# Patient Record
Sex: Female | Born: 1960 | Race: White | Hispanic: No | Marital: Married | Smoking: Never smoker
Health system: Southern US, Community
[De-identification: ages and names within clinical notes are randomized; demographics above are authoritative.]

## PROBLEM LIST (undated history)

## (undated) DIAGNOSIS — C801 Malignant (primary) neoplasm, unspecified: Secondary | ICD-10-CM

## (undated) HISTORY — PX: ABDOMINAL HYSTERECTOMY: SHX81

## (undated) HISTORY — PX: COLON SURGERY: SHX602

---

## 2005-04-05 ENCOUNTER — Ambulatory Visit: Payer: Self-pay

## 2006-04-09 ENCOUNTER — Ambulatory Visit: Payer: Self-pay

## 2007-04-15 ENCOUNTER — Ambulatory Visit: Payer: Self-pay

## 2008-05-12 ENCOUNTER — Ambulatory Visit: Payer: Self-pay

## 2008-07-28 ENCOUNTER — Ambulatory Visit: Payer: Self-pay | Admitting: Obstetrics and Gynecology

## 2008-08-04 ENCOUNTER — Inpatient Hospital Stay: Payer: Self-pay | Admitting: Obstetrics and Gynecology

## 2009-05-18 ENCOUNTER — Ambulatory Visit: Payer: Self-pay

## 2010-06-20 ENCOUNTER — Ambulatory Visit: Payer: Self-pay

## 2011-03-20 DIAGNOSIS — L570 Actinic keratosis: Secondary | ICD-10-CM | POA: Insufficient documentation

## 2011-03-20 DIAGNOSIS — Z85820 Personal history of malignant melanoma of skin: Secondary | ICD-10-CM | POA: Insufficient documentation

## 2011-07-03 ENCOUNTER — Ambulatory Visit: Payer: Self-pay

## 2012-07-31 ENCOUNTER — Ambulatory Visit: Payer: Self-pay | Admitting: Unknown Physician Specialty

## 2012-08-11 ENCOUNTER — Ambulatory Visit: Payer: Self-pay

## 2012-08-18 ENCOUNTER — Ambulatory Visit: Payer: Self-pay | Admitting: Surgery

## 2012-08-22 ENCOUNTER — Inpatient Hospital Stay: Payer: Self-pay | Admitting: Surgery

## 2012-08-26 LAB — PATHOLOGY REPORT

## 2012-12-08 DIAGNOSIS — L719 Rosacea, unspecified: Secondary | ICD-10-CM | POA: Insufficient documentation

## 2013-08-18 ENCOUNTER — Ambulatory Visit: Payer: Self-pay

## 2014-07-26 ENCOUNTER — Ambulatory Visit: Payer: Self-pay | Admitting: Urology

## 2014-09-09 ENCOUNTER — Ambulatory Visit: Payer: Self-pay

## 2014-12-10 NOTE — Op Note (Signed)
PATIENT NAME:  Norma Hall, Norma Hall MR#:  [id number] DATE OF BIRTH:  [birth year]  DATE OF PROCEDURE:  08/22/2012  PREOPERATIVE DIAGNOSIS:  A neoplasm of the terminal ileum.   POSTOPERATIVE DIAGNOSIS: A neoplasm of the terminal ileum.   PROCEDURE: Laparoscopic ileocolectomy.   SURGEON: Rochel Brome, MD    ANESTHESIA: General.   INDICATIONS: This 54 year old female recently had screening colonoscopy with findings of a submucosal mass of the terminal ileum. She also had CT scan which demonstrated a bilobed mass at this site. No other intestinal masses were found. Surgery is recommended for definitive treatment.   DESCRIPTION OF PROCEDURE: The patient was placed on the operating table in the supine position under general endotracheal anesthesia. The abdomen was repaired with ChloraPrep, draped in a sterile manner.   A short incision was made just below the umbilicus and carried down through subcutaneous tissues. Several small bleeding points were cauterized.  The deep fascia was grasped with a laryngeal hook and elevated. A Veress needle was inserted, aspirated and irrigated with a saline solution. Next, the peritoneal cavity was inflated with the carbon dioxide. The Veress needle was removed. The 10 mm cannula was inserted. The 10 mm 25-degree laparoscope was inserted to view the peritoneal cavity and noted the location of the cecum in the right lower quadrant. The stomach appeared normal. The liver appeared normal. No masses were seen in the liver.  Next, another incision was made just about 4 cm cephalad to the umbilicus to insert an 11 mm cannula. Another incision was made in the right mid abdomen to introduce an 11 mm cannula.   Babcock clamps were used to elevate the cecum examine the terminal ileum, which did have visible evidence of a mass which was fairly close to the ileocecal valve. The appendix was identified and appeared normal. Next, the small bowel was examined, following this up to the  ligament of Treitz, and no other tumors were seen. Next, the cecum and ascending colon were mobilized with incision of the lateral peritoneal reflection using the harmonic scalpel. Also, the terminal ileum and appendix were mobilized as well; and this mobilization was continued until the point where the cecum and ascending colon were quite mobile and could be maneuvered over to the left side, and the appendix and terminal ileum were mobile as well. Next, completing this portion of the procedure, the laparoscopic instruments were removed. An incision was made from the epigastric port site to the infragastric port site, which was approximately 6 cm in length, and was carried down through subcutaneous tissues. The midline fascia was incised.  Next, the terminal ileum, appendix, cecum and ascending colon were brought out of the abdominal wall.  The mass was further identified just proximal to the ileocecal valve. It appeared to be some 4 cm in dimension and consistent with bilobed description from the CT. The mass was smooth and soft. There was no palpable adenopathy. Next, a proximal site for a  margin of resection was selected some 4 inches proximal to the ileocecal valve and another site some 5 inches distal to the ileocecal valve. The bowel was further dissected so that a window was created in the mesentery of the terminal ileum and also a window created so that an instrument could be passed around the ascending colon. Next, the mesenteric dissection was carried out with a harmonic scalpel, removing a V-shaped portion of tissue, dissecting down deeply along the ileocolic vessels. The ileocolic vessels were suture ligated with 0 chromic and the  mesentery was further divided with the harmonic scalpel. Next, the terminal ileum was placed beside the ascending colon and held with Allis clamps. Next, an enterotomy was made with electrocautery, and then a colotomy was made along the tinea coli in a similar manner. The  anastomosis was begun with introduction of the 75 mm GIA stapler which was placed along the antimesenteric borders and activated. Hemostasis appeared to be intact. Next, the anastomosis was completed with application of the TA 60 stapler which was placed perpendicular to the first, engaged and activated, and the specimen was excised along the staple line and passed off to a side table. Next, several bleeding points along the staple line were cauterized. Three bleeding points were suture ligated with 5-0 Vicryl. Next, the mesenteric defect was closed with a running 3-0 chromic. The junction of the staple lines was imbricated with 5-0 Vicryl. The anastomosis looked good and was widely patent, and hemostasis was subsequently intact. Next, this portion of bowel was returned to the abdominal cavity. A pool suction was inserted, and there was no significant amount of blood found along the right colic gutter. Next, a small amount of omentum was brought beneath the wound, and the midline fascia was closed with interrupted 0 Maxon figure-of-eight sutures. There was some oozing within the wound. Several small bleeding points were cauterized. This was observed for several minutes and determined hemostasis was intact. The subcutaneous tissues and tissues adjacent to the fascia were infiltrated with 0.5% Sensorcaine with epinephrine. The skin was approximated with interrupted 4-0 nylon vertical mattress      sutures. Dressings were applied with paper tape. The patient tolerated surgery satisfactorily and was then prepared for transfer to the recovery room.   ____________________________ Lenna Sciara. Rochel Brome, MD [email or web address]:cb D: 08/22/2012 16:41:58 ET T: 08/22/2012 18:44:15 ET JOB#: [id number]  cc: Loreli Dollar, MD, <Dictator> Loreli Dollar MD ELECTRONICALLY SIGNED 08/25/2012 19:31

## 2014-12-10 NOTE — Discharge Summary (Signed)
PATIENT NAME:  Norma Hall, Norma Hall MR#:  [id number] DATE OF BIRTH:  09-03-1960  DATE OF ADMISSION:  08/22/2012 DATE OF DISCHARGE:  08/27/2012  HISTORY OF PRESENT ILLNESS:  This 54 year old female was admitted for elective surgery.  She had recently had a screening colonoscopy which demonstrated a submucosal mass of the terminal ileum.  CT scan also demonstrated this mass and surgery was recommended for definitive treatment.   PAST MEDICAL HISTORY:  Occasional migraine headache.    PAST SURGICAL HISTORY:  Included hysterectomy in 2009.   PHYSICAL EXAMINATION: ABDOMEN:  Was soft and flat, nontender.   She was carried to the operating room where she had a laparoscopic right ileocolectomy.    Postoperatively, she was treated with IV fluids, analgesics, subcutaneous heparin.  It is noted she did have a preop dose of intravenous Invanz.   During the postoperative period she made satisfactory progress, soon was began on a liquid diet and gradually advanced, did demonstrate bowel activity prior to discharge.   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   DIAGNOSIS:  Neurofibroma of the terminal ileum.   OPERATION:  Laparoscopic ileocolectomy.   SURGEON:  Dr. Rochel Brome.   Discharge instructions were given and plans made for followup in the office.     ____________________________ J. Rochel Brome, MD [email or web address] D: 09/02/2012 18:52:44 ET T: 09/02/2012 23:50:13 ET JOB#: [id number]  cc: Loreli Dollar, MD, <Dictator> Loreli Dollar MD ELECTRONICALLY SIGNED 09/05/2012 16:58

## 2015-06-30 ENCOUNTER — Other Ambulatory Visit: Payer: Self-pay | Admitting: Obstetrics and Gynecology

## 2015-06-30 DIAGNOSIS — Z1231 Encounter for screening mammogram for malignant neoplasm of breast: Secondary | ICD-10-CM

## 2015-09-12 ENCOUNTER — Other Ambulatory Visit: Payer: Self-pay | Admitting: Obstetrics and Gynecology

## 2015-09-12 ENCOUNTER — Ambulatory Visit
Admission: RE | Admit: 2015-09-12 | Discharge: 2015-09-12 | Disposition: A | Discharge status: Home / Self Care | Payer: BC Managed Care – PPO | Source: Ambulatory Visit | Attending: Obstetrics and Gynecology | Admitting: Obstetrics and Gynecology

## 2015-09-12 DIAGNOSIS — Z1231 Encounter for screening mammogram for malignant neoplasm of breast: Secondary | ICD-10-CM

## 2015-09-12 DIAGNOSIS — R921 Mammographic calcification found on diagnostic imaging of breast: Secondary | ICD-10-CM | POA: Insufficient documentation

## 2015-09-12 HISTORY — DX: Malignant (primary) neoplasm, unspecified: C80.1

## 2015-09-15 ENCOUNTER — Other Ambulatory Visit: Payer: Self-pay | Admitting: Obstetrics and Gynecology

## 2015-09-15 DIAGNOSIS — R921 Mammographic calcification found on diagnostic imaging of breast: Secondary | ICD-10-CM

## 2015-09-23 ENCOUNTER — Ambulatory Visit: Admission: RE | Admit: 2015-09-23 | Payer: BC Managed Care – PPO | Source: Ambulatory Visit

## 2015-09-23 ENCOUNTER — Ambulatory Visit
Admission: RE | Admit: 2015-09-23 | Discharge: 2015-09-23 | Disposition: A | Discharge status: Home / Self Care | Payer: BC Managed Care – PPO | Source: Ambulatory Visit | Attending: Obstetrics and Gynecology | Admitting: Obstetrics and Gynecology

## 2015-09-23 DIAGNOSIS — R920 Mammographic microcalcification found on diagnostic imaging of breast: Secondary | ICD-10-CM | POA: Insufficient documentation

## 2015-09-23 DIAGNOSIS — R921 Mammographic calcification found on diagnostic imaging of breast: Secondary | ICD-10-CM

## 2015-09-26 ENCOUNTER — Other Ambulatory Visit: Payer: Self-pay | Admitting: Obstetrics and Gynecology

## 2015-09-26 DIAGNOSIS — R921 Mammographic calcification found on diagnostic imaging of breast: Secondary | ICD-10-CM

## 2015-09-27 ENCOUNTER — Ambulatory Visit
Admission: RE | Admit: 2015-09-27 | Discharge: 2015-09-27 | Disposition: A | Discharge status: Home / Self Care | Payer: BC Managed Care – PPO | Source: Ambulatory Visit | Attending: Obstetrics and Gynecology | Admitting: Obstetrics and Gynecology

## 2015-09-27 DIAGNOSIS — R921 Mammographic calcification found on diagnostic imaging of breast: Secondary | ICD-10-CM

## 2015-09-27 DIAGNOSIS — R920 Mammographic microcalcification found on diagnostic imaging of breast: Secondary | ICD-10-CM | POA: Diagnosis not present

## 2015-09-27 HISTORY — PX: BREAST BIOPSY: SHX20

## 2015-09-28 LAB — SURGICAL PATHOLOGY

## 2015-10-07 ENCOUNTER — Other Ambulatory Visit: Payer: Self-pay | Admitting: Obstetrics and Gynecology

## 2015-10-07 DIAGNOSIS — R920 Mammographic microcalcification found on diagnostic imaging of breast: Secondary | ICD-10-CM

## 2016-03-28 ENCOUNTER — Other Ambulatory Visit: Payer: BC Managed Care – PPO

## 2016-03-28 ENCOUNTER — Ambulatory Visit: Payer: BC Managed Care – PPO | Attending: Obstetrics and Gynecology

## 2016-05-11 ENCOUNTER — Ambulatory Visit
Admission: RE | Admit: 2016-05-11 | Discharge: 2016-05-11 | Disposition: A | Discharge status: Home / Self Care | Payer: BC Managed Care – PPO | Source: Ambulatory Visit | Attending: Obstetrics and Gynecology | Admitting: Obstetrics and Gynecology

## 2016-05-11 DIAGNOSIS — R920 Mammographic microcalcification found on diagnostic imaging of breast: Secondary | ICD-10-CM | POA: Diagnosis present

## 2016-05-11 DIAGNOSIS — R921 Mammographic calcification found on diagnostic imaging of breast: Secondary | ICD-10-CM | POA: Diagnosis not present

## 2016-07-04 ENCOUNTER — Other Ambulatory Visit: Payer: Self-pay | Admitting: Obstetrics and Gynecology

## 2016-07-04 DIAGNOSIS — Z1231 Encounter for screening mammogram for malignant neoplasm of breast: Secondary | ICD-10-CM

## 2016-09-13 ENCOUNTER — Ambulatory Visit
Admission: RE | Admit: 2016-09-13 | Discharge: 2016-09-13 | Disposition: A | Discharge status: Home / Self Care | Payer: BC Managed Care – PPO | Source: Ambulatory Visit | Attending: Obstetrics and Gynecology | Admitting: Obstetrics and Gynecology

## 2016-09-13 DIAGNOSIS — Z1231 Encounter for screening mammogram for malignant neoplasm of breast: Secondary | ICD-10-CM | POA: Insufficient documentation

## 2016-09-21 ENCOUNTER — Other Ambulatory Visit: Payer: Self-pay | Admitting: Obstetrics and Gynecology

## 2016-09-21 DIAGNOSIS — R928 Other abnormal and inconclusive findings on diagnostic imaging of breast: Secondary | ICD-10-CM

## 2016-09-21 DIAGNOSIS — N6310 Unspecified lump in the right breast, unspecified quadrant: Secondary | ICD-10-CM

## 2016-10-02 ENCOUNTER — Ambulatory Visit
Admission: RE | Admit: 2016-10-02 | Discharge: 2016-10-02 | Disposition: A | Discharge status: Home / Self Care | Payer: BC Managed Care – PPO | Source: Ambulatory Visit | Attending: Obstetrics and Gynecology | Admitting: Obstetrics and Gynecology

## 2016-10-02 DIAGNOSIS — N6489 Other specified disorders of breast: Secondary | ICD-10-CM | POA: Insufficient documentation

## 2016-10-02 DIAGNOSIS — R928 Other abnormal and inconclusive findings on diagnostic imaging of breast: Secondary | ICD-10-CM | POA: Diagnosis not present

## 2016-10-02 DIAGNOSIS — N6310 Unspecified lump in the right breast, unspecified quadrant: Secondary | ICD-10-CM | POA: Diagnosis present

## 2017-07-16 ENCOUNTER — Other Ambulatory Visit: Payer: Self-pay | Admitting: Obstetrics and Gynecology

## 2017-07-16 DIAGNOSIS — Z1231 Encounter for screening mammogram for malignant neoplasm of breast: Secondary | ICD-10-CM

## 2017-09-16 ENCOUNTER — Ambulatory Visit
Admission: RE | Admit: 2017-09-16 | Discharge: 2017-09-16 | Disposition: A | Discharge status: Home / Self Care | Payer: BC Managed Care – PPO | Source: Ambulatory Visit | Attending: Obstetrics and Gynecology | Admitting: Obstetrics and Gynecology

## 2017-09-16 DIAGNOSIS — Z1231 Encounter for screening mammogram for malignant neoplasm of breast: Secondary | ICD-10-CM | POA: Diagnosis present

## 2018-07-22 ENCOUNTER — Other Ambulatory Visit: Payer: Self-pay | Admitting: Obstetrics and Gynecology

## 2018-07-22 DIAGNOSIS — Z1231 Encounter for screening mammogram for malignant neoplasm of breast: Secondary | ICD-10-CM

## 2018-09-18 ENCOUNTER — Ambulatory Visit
Admission: RE | Admit: 2018-09-18 | Discharge: 2018-09-18 | Disposition: A | Discharge status: Home / Self Care | Payer: BC Managed Care – PPO | Source: Ambulatory Visit | Attending: Obstetrics and Gynecology | Admitting: Obstetrics and Gynecology

## 2018-09-18 DIAGNOSIS — Z1231 Encounter for screening mammogram for malignant neoplasm of breast: Secondary | ICD-10-CM

## 2019-07-23 ENCOUNTER — Ambulatory Visit: Payer: BC Managed Care – PPO | Admitting: General Surgery

## 2019-07-28 ENCOUNTER — Other Ambulatory Visit: Payer: Self-pay | Admitting: Obstetrics and Gynecology

## 2019-07-28 DIAGNOSIS — Z1231 Encounter for screening mammogram for malignant neoplasm of breast: Secondary | ICD-10-CM

## 2019-07-30 ENCOUNTER — Ambulatory Visit
Admission: RE | Admit: 2019-07-30 | Discharge: 2019-07-30 | Disposition: A | Discharge status: Home / Self Care | Payer: Self-pay | Source: Ambulatory Visit | Attending: General Surgery | Admitting: General Surgery

## 2019-07-30 ENCOUNTER — Ambulatory Visit (INDEPENDENT_AMBULATORY_CARE_PROVIDER_SITE_OTHER): Payer: BC Managed Care – PPO | Admitting: General Surgery

## 2019-07-30 ENCOUNTER — Other Ambulatory Visit: Payer: Self-pay | Admitting: General Surgery

## 2019-07-30 ENCOUNTER — Other Ambulatory Visit: Payer: Self-pay

## 2019-07-30 ENCOUNTER — Encounter: Payer: Self-pay | Admitting: General Surgery

## 2019-07-30 VITALS — BP 108/65 | HR 76 | Temp 97.3°F | Resp 14 | Ht 68.0 in | Wt 156.0 lb

## 2019-07-30 DIAGNOSIS — E042 Nontoxic multinodular goiter: Secondary | ICD-10-CM | POA: Diagnosis not present

## 2019-07-30 DIAGNOSIS — E041 Nontoxic single thyroid nodule: Secondary | ICD-10-CM

## 2019-07-30 NOTE — Progress Notes (Signed)
Patient ID: Norma Hall, female   DOB: 10/21/1960, 58 y.o.   MRN: [id number]  Chief Complaint  Patient presents with  . New Patient (Initial Visit)    Thyroid nodules    HPI Norma Hall is a 58 y.o. female.   She has been referred by Dr. Elisabeth Cara for surgical evaluation of thyroid nodules.  She has 3 thyroid nodules that have been monitored with ultrasound.  The dominant nodule in the isthmus has been biopsied on several occasions and been benign on each evaluation.  She saw Dr. Gabriel Carina in the middle of November and reported that she felt like the nodule in her isthmus had grown over the past year.  She denies any dysphagia or pressure in the neck while in the supine position.  She does notice the nodule when she turns her head.  She has occasional hoarseness or scratchiness to her voice, but this is chronic and unchanged.  She does endorse frequent throat clearing.  She denies heat or cold intolerance.  No diarrhea or constipation.  No heart palpitations or hand tremors.  She does state that she feels anxious about our visit today, but this is not a ongoing issue.  She reports that her hair does seem thinner and that her skin is dry.  Her weight has been stable.  She denies any ocular symptoms.  She has no head and neck exposure to ionizing radiation, either occupational or therapeutic.  Her sister had thyroid cancer, treated with surgery and radioactive iodine.  She also has a friend who was diagnosed with thyroid cancer.  She is concerned that her nodules could potentially evolve into cancer and she would like to be proactive in addressing them.   Past Medical History:  Diagnosis Date  . Cancer ([place])    melanoma    Past Surgical History:  Procedure Laterality Date  . ABDOMINAL HYSTERECTOMY    . BREAST BIOPSY Right 09/27/2015   neg/ stereo biopsy  . COLON SURGERY     neurofibroma of terminal ileum    Family History  Problem Relation Age of Onset  . Breast cancer Maternal  Grandmother 80  . Hypertension Mother   . Dementia Father   . Thyroid cancer Sister     Social History Social History   Tobacco Use  . Smoking status: Never Smoker  . Smokeless tobacco: Never Used  Substance Use Topics  . Alcohol use: Never  . Drug use: Never    Allergies  Allergen Reactions  . Morphine Nausea Only and Other (See Comments)    Current Outpatient Medications  Medication Sig Dispense Refill  . adapalene (DIFFERIN) 0.1 % cream Apply pea sized amount to entire face nightly    . mometasone (ELOCON) 0.1 % cream Apply topically.     No current facility-administered medications for this visit.    Review of Systems Review of Systems  All other systems reviewed and are negative. Or as discussed in the history of present illness.   Today's Vitals   07/30/19 0905  BP: 108/65  Pulse: 76  Resp: 14  Temp: (!) 97.3 F (36.3 C)  SpO2: 98%  Weight: 156 lb (70.8 kg)  Height: 5\' 8"  (1.727 m)  PainSc: 0-No pain   Body mass index is 23.72 kg/m.  Physical Exam Physical Exam Constitutional:      Appearance: Normal appearance. She is normal weight.  HENT:     Head: Normocephalic and atraumatic.     Nose:     Comments:  Covered with a mask secondary to COVID-19 precautions    Mouth/Throat:     Comments: Covered with a mask secondary to COVID-19 precautions Eyes:     General: No scleral icterus.       Right eye: No discharge.        Left eye: No discharge.     Conjunctiva/sclera: Conjunctivae normal.     Comments: No proptosis or exophthalmos  Neck:     Comments: There is a visible thyroid nodule present in the isthmus.  On palpation, it is smoothly contoured and moves freely with deglutition.  It has a slightly rubbery texture to it.  She has no palpable cervical or supraclavicular lymphadenopathy. Cardiovascular:     Rate and Rhythm: Normal rate and regular rhythm.     Pulses: Normal pulses.     Heart sounds: No murmur.  Pulmonary:     Effort: Pulmonary  effort is normal.     Breath sounds: Normal breath sounds.  Abdominal:     General: Abdomen is flat. Bowel sounds are normal.     Palpations: Abdomen is soft.  Genitourinary:    Comments: Deferred Musculoskeletal:        General: No swelling or tenderness.     Right lower leg: No edema.     Left lower leg: No edema.  Skin:    General: Skin is warm and dry.  Neurological:     General: No focal deficit present.     Mental Status: She is alert and oriented to person, place, and time.  Psychiatric:        Mood and Affect: Mood normal.        Behavior: Behavior normal.        Thought Content: Thought content normal.     Data Reviewed I reviewed Dr. Joycie Peek notes over the past several years.  The most recent of these was filed July 02, 2019.  This includes a report of a thyroid ultrasound which is copied here; the actual images are not available for my review.  The patient did bring disks with imaging from prior ultrasounds.  On both of these, only the thyroid isthmus nodule is depicted.  My personal interpretation is of a roughly 2 cm solid and hypoechoic nodule within the isthmus.  It is well-circumscribed and has an anechoic halo.  Indication: Thyroid nodules  Comparison: Neck ultrasounds on 06/11/18, 05/17/17, 05/11/16, 10/14/15 and  04/14/15  Technique: Gray-scale and color Doppler images of the neck were obtained.  Findings: The right lobe of the thyroid measures 5.18 x 1.28 x 1.07 cm. The left lobe of the thyroid measures 5.86 x 1.46 x 1.39 cm. The isthmus measures 0.61 cm in AP depth.  Echotexture of the thyroid is homogeneous.  Within the right lobe, there is an upper pole 8 mm cystic nodule.  Within the left lobe, there is an upper pole 6 mm cystic nodule and a mid  pole 1.12 x 0.61 x 0.87 cm hypoechoic nodule.   In the left aspect of the isthmus is a 2.21 x 1.41 x 1.86 cm cystic  nodule.   There are no calcifications or regions of hypervascularity  within the  thyroid gland.  There are no significantly enlarged lymph nodes in the anterior neck.  Impression: Homogeneous thyroid gland which is not enlarged. There are multiple  nodules throughout the thyroid gland. This is consistent with a  multinodular goiter.  There is a dominant 2.2 cm cystic isthmus nodule.   All nodules are  stable in comparison to last thyroid ultrasound on  06/11/18.  Thyroid function testing was performed.  I reviewed the results in care everywhere; dating back to 2015, her thyroid function tests have been within normal limits.  The most recent value was a TSH of 1.099.  Biopsy reports were provided by Dr. Joycie Peek office.  These were performed in 2017 and 2018.  On both occasions, the lesion was benign.  Assessment This is a 58 year old woman with a multinodular goiter.  She has had the dominant nodule in the isthmus biopsied on 2 separate occasions.  Both times it was benign.  She also has a small cystic nodule on the right as well as 2 small nodules on the left.  She feels like the isthmus nodule is growing.  She is concerned about potential malignant transformation as well as simply the cosmesis of the nodule.  She is interested in surgical intervention.  Plan I spent greater than 50% of our 45-minute appointment today discussing thyroid nodules and their natural history with Ms. Tufte.  I explained that the majority of thyroid nodules are benign, and that they are extremely common in the general population.  I discussed that she has had 2 benign biopsies of the same nodule and that it was highly unlikely that there would be missed malignancy or malignant transformation in this nodule.  I explained that the other lesions in her thyroid do not have any red flag features to them, either.  I explained the various surgical options to her.  Isthmusectomy is not an operation that is generally recommended.  We normally perform a thyroid lobectomy and include the  isthmus in order to resect isthmic nodules.  I discussed the risks of thyroid lobectomy versus total thyroidectomy.  If she were to choose lobectomy, I suggested that we remove the left lobe and isthmus, leaving only the right lobe with a small cystic lesion for ongoing surveillance.  I discussed the risk of injury to the recurrent laryngeal nerve and that with lobectomy, only one nerve is at risk, whereas with total thyroidectomy, both nerves are potentially at risk.  I also assured her that the risk of permanent injury to either nerve is less than one half of 1% in experienced hands.  I described the role of the parathyroid glands and their location in relationship to the thyroid.  We discussed that a lobectomy would not subject her to the risks of hypocalcemia/hypoparathyroidism, whereas there is a roughly 8% risk of temporary hypocalcemia with a total thyroidectomy.  Roughly 85% of patients who undergo thyroid lobectomy do not require thyroid hormone replacement, but about 15% ultimately do.  If she had a total thyroidectomy, she would, of course, require thyroid hormone replacement for life.  I also let her know that I could certainly perform a completion thyroidectomy in the future, if she ultimately desired removal of all of her thyroid tissue.  I explained to Ms. Distel that cosmetic concerns and alleviation of anxiety are certainly very reasonable reasons to pursue surgical intervention.  She is interested in pursuing surgery at this time, however she is not certain which procedure she would prefer.  She would like some time to discuss this further with her family.  She will contact our office when she is ready to schedule an operation and also let us know which procedure she prefers.    Fredirick Maudlin 07/30/2019, 11:42 AM

## 2019-07-30 NOTE — Patient Instructions (Signed)
Please call us once you decide what surgery you would like and our surgery scheduler will speak with you about dates and information.    Thyroidectomy A thyroidectomy is a surgery that is done to remove the thyroid gland. The thyroid is a butterfly-shaped gland that is located at the lower front of your neck. It produces thyroid hormone, which is a substance that helps to control certain body processes. You may have a:  Total thyroidectomy. All of your thyroid is removed.  Thyroid lobectomy. Part of your thyroid is removed. The amount of thyroid gland tissue that is removed during your surgery depends on the reason for the procedure. Reasons to have this procedure include treatment for:  Thyroid nodules.  Thyroid cancer.  Benign thyroid tumors.  Goiter.  Overactive thyroid gland (hyperthyroidism). There are two ways to do this procedure. Conventional, or open, thyroidectomy uses one large incision to remove the thyroid gland. This is the most common method. Endoscopic thyroidectomy, a less invasive method, uses a narrow tube with a light and camera (endoscope) to remove the gland. Tell a health care provider about:  Any allergies you have.  All medicines you are taking, including vitamins, herbs, eye drops, creams, and over-the-counter medicines.  Any problems you or family members have had with anesthetic medicines.  Any blood disorders you have.  Any surgeries you have had.  Any medical conditions you have.  Whether you are pregnant or may be pregnant. What are the risks? Generally, this is a safe procedure. However, problems may occur, including:  Damage to the parathyroid glands. These are located behind your thyroid gland. They maintain the calcium levels in the body. Damage may lead to: ? A decrease in parathyroid hormone levels (hypoparathyroidism). ? A decrease in calcium levels. This will make your nerves irritable and may cause muscle spasms.  An increase in  thyroid hormone.  Damage to the nerves of your voice box (larynx). This can be temporary or long-term (rare).  Hoarseness. This usually resolves in 24-48 hours.  Bleeding.  Infection. What happens before the procedure? Staying hydrated Follow instructions from your health care provider about hydration, which may include:  Up to 2 hours before the procedure - you may continue to drink clear liquids, such as water, clear fruit juice, black coffee, and plain tea. Eating and drinking restrictions Follow instructions from your health care provider about eating and drinking, which may include:  8 hours before the procedure - stop eating heavy meals or foods such as meat, fried foods, or fatty foods.  6 hours before the procedure - stop eating light meals or foods, such as toast or cereal.  6 hours before the procedure - stop drinking milk or drinks that contain milk.  2 hours before the procedure - stop drinking clear liquids. Medicines Ask your health care provider about:  Changing or stopping your regular medicines. This is especially important if you are taking diabetes medicines or blood thinners.  Taking medicines such as aspirin and ibuprofen. These medicines can thin your blood. Do not take these medicines unless your health care provider tells you to take them.  Taking over-the-counter medicines, vitamins, herbs, and supplements. General instructions  You may be asked to shower with a germ-killing soap.  Plan to have someone take you home from the hospital or clinic.  Plan to have a responsible adult care for you for at least 24 hours after you leave the hospital or clinic. This is important. What happens during the procedure?  To reduce your risk of infection: ? Your health care team will wash or sanitize their hands. ? Hair may be removed from the surgical area. ? Your skin will be washed with soap.  An IV will be inserted into one of your veins.  You will be given  one or more of the following: ? A medicine to help you relax (sedative). ? A medicine to make you fall asleep (general anesthetic).  Your health care provider will perform your surgery using one of two methods: ? For open thyroidectomy, an incision will be made in your lower neck. Muscles in the area will be separated to reveal your thyroid gland. ? For endoscopic thyroidectomy, several small incisions will be made in your neck, chest, or armpit. An endoscopewill be inserted into an incision.  Your health care provider may monitor laryngeal nerve function during the procedure for safety reasons.  Part or all of your thyroid gland will be removed.  A tube (drain) may be placed at the incision site to drain blood and fluids that accumulate under the skin after the procedure. The drain may have to stay in place for a day or two after the procedure.  The incision will be closed with stitches (sutures).  A dressing will be placed over your incision. The procedure may vary among health care providers and hospitals. What happens after the procedure?  Your blood pressure, heart rate, breathing rate, and blood oxygen level will be monitored often until the medicines you were given have worn off.  You will be given pain medicine as needed.  Your provider will check your ability to talk and swallow after the procedure.  You will gradually start to drink liquids and have soft foods as tolerated.  You may have a blood test to check the level of calcium in your body.  If you had a drain put in during the procedure, it will usually be removed the next day. Summary  A thyroidectomy is a surgery that is done to remove the thyroid gland.  The procedure will be done in one of two ways: conventional, or open, thyroidectomy or endoscopic thyroidectomy.  Serious complications are rare.  Plan to have a responsible adult care for you for at least 24 hours after you leave the hospital or clinic. This  is important. This information is not intended to replace advice given to you by your health care provider. Make sure you discuss any questions you have with your health care provider. Document Released: 01/30/2001 Document Revised: 07/19/2017 Document Reviewed: 06/11/2017 Elsevier Patient Education  2020 [employer].

## 2019-08-12 ENCOUNTER — Telehealth: Payer: Self-pay | Admitting: General Surgery

## 2019-08-18 NOTE — Telephone Encounter (Signed)
I have called patient to go over surgery information. Patient stated that she would like to cancel surgery at this time due to the Covid Pandemic. She stated that she would call our office back once she decides to reschedule.

## 2019-08-18 NOTE — Telephone Encounter (Signed)
OK. Thanks.

## 2019-09-09 ENCOUNTER — Other Ambulatory Visit: Payer: BC Managed Care – PPO

## 2019-09-10 ENCOUNTER — Other Ambulatory Visit: Payer: BC Managed Care – PPO

## 2019-09-14 ENCOUNTER — Other Ambulatory Visit: Payer: BC Managed Care – PPO

## 2019-09-16 ENCOUNTER — Ambulatory Visit: Admit: 2019-09-16 | Payer: BC Managed Care – PPO | Admitting: General Surgery

## 2019-09-16 SURGERY — THYROIDECTOMY
Anesthesia: General

## 2019-09-22 ENCOUNTER — Ambulatory Visit
Admission: RE | Admit: 2019-09-22 | Discharge: 2019-09-22 | Disposition: A | Discharge status: Home / Self Care | Payer: BC Managed Care – PPO | Source: Ambulatory Visit | Attending: Obstetrics and Gynecology | Admitting: Obstetrics and Gynecology

## 2019-09-22 DIAGNOSIS — Z1231 Encounter for screening mammogram for malignant neoplasm of breast: Secondary | ICD-10-CM | POA: Insufficient documentation

## 2020-08-08 ENCOUNTER — Other Ambulatory Visit: Payer: Self-pay | Admitting: Obstetrics and Gynecology

## 2020-08-08 DIAGNOSIS — Z1231 Encounter for screening mammogram for malignant neoplasm of breast: Secondary | ICD-10-CM

## 2020-09-22 ENCOUNTER — Ambulatory Visit
Admission: RE | Admit: 2020-09-22 | Discharge: 2020-09-22 | Disposition: A | Discharge status: Home / Self Care | Payer: BC Managed Care – PPO | Source: Ambulatory Visit | Attending: Obstetrics and Gynecology | Admitting: Obstetrics and Gynecology

## 2020-09-22 ENCOUNTER — Other Ambulatory Visit: Payer: Self-pay

## 2020-09-22 DIAGNOSIS — Z1231 Encounter for screening mammogram for malignant neoplasm of breast: Secondary | ICD-10-CM | POA: Diagnosis not present

## 2021-05-29 ENCOUNTER — Encounter: Payer: Self-pay | Admitting: General Surgery

## 2021-09-21 ENCOUNTER — Other Ambulatory Visit: Payer: Self-pay | Admitting: Obstetrics and Gynecology

## 2021-09-21 DIAGNOSIS — Z1231 Encounter for screening mammogram for malignant neoplasm of breast: Secondary | ICD-10-CM

## 2021-09-27 ENCOUNTER — Ambulatory Visit
Admission: RE | Admit: 2021-09-27 | Discharge: 2021-09-27 | Disposition: A | Discharge status: Home / Self Care | Payer: BC Managed Care – PPO | Source: Ambulatory Visit | Attending: Obstetrics and Gynecology | Admitting: Obstetrics and Gynecology

## 2021-09-27 DIAGNOSIS — Z1231 Encounter for screening mammogram for malignant neoplasm of breast: Secondary | ICD-10-CM | POA: Diagnosis not present

## 2021-10-04 ENCOUNTER — Other Ambulatory Visit: Payer: Self-pay | Admitting: Obstetrics and Gynecology

## 2021-10-04 DIAGNOSIS — R921 Mammographic calcification found on diagnostic imaging of breast: Secondary | ICD-10-CM

## 2021-10-04 DIAGNOSIS — R928 Other abnormal and inconclusive findings on diagnostic imaging of breast: Secondary | ICD-10-CM

## 2021-10-10 ENCOUNTER — Other Ambulatory Visit: Payer: Self-pay

## 2021-10-10 ENCOUNTER — Ambulatory Visit
Admission: RE | Admit: 2021-10-10 | Discharge: 2021-10-10 | Disposition: A | Discharge status: Home / Self Care | Payer: BC Managed Care – PPO | Source: Ambulatory Visit | Attending: Obstetrics and Gynecology | Admitting: Obstetrics and Gynecology

## 2021-10-10 DIAGNOSIS — R921 Mammographic calcification found on diagnostic imaging of breast: Secondary | ICD-10-CM | POA: Diagnosis present

## 2021-10-10 DIAGNOSIS — R928 Other abnormal and inconclusive findings on diagnostic imaging of breast: Secondary | ICD-10-CM | POA: Insufficient documentation

## 2021-10-16 ENCOUNTER — Other Ambulatory Visit: Payer: Self-pay | Admitting: Obstetrics and Gynecology

## 2021-10-16 DIAGNOSIS — R921 Mammographic calcification found on diagnostic imaging of breast: Secondary | ICD-10-CM

## 2021-10-16 DIAGNOSIS — R928 Other abnormal and inconclusive findings on diagnostic imaging of breast: Secondary | ICD-10-CM

## 2021-10-20 ENCOUNTER — Ambulatory Visit
Admission: RE | Admit: 2021-10-20 | Discharge: 2021-10-20 | Disposition: A | Discharge status: Home / Self Care | Payer: BC Managed Care – PPO | Source: Ambulatory Visit | Attending: Obstetrics and Gynecology | Admitting: Obstetrics and Gynecology

## 2021-10-20 ENCOUNTER — Other Ambulatory Visit: Payer: Self-pay

## 2021-10-20 DIAGNOSIS — R921 Mammographic calcification found on diagnostic imaging of breast: Secondary | ICD-10-CM | POA: Insufficient documentation

## 2021-10-20 DIAGNOSIS — R928 Other abnormal and inconclusive findings on diagnostic imaging of breast: Secondary | ICD-10-CM | POA: Insufficient documentation

## 2021-10-20 HISTORY — PX: BREAST BIOPSY: SHX20

## 2021-10-23 LAB — SURGICAL PATHOLOGY

## 2021-10-25 ENCOUNTER — Ambulatory Visit: Payer: BC Managed Care – PPO

## 2022-07-01 IMAGING — MG MM BREAST LOCALIZATION CLIP
4 series · 4 of 12 positions shown · non-contrast
Comparison: Previous exam(s).

CLINICAL DATA: Post biopsy mammogram of the left breast for clip
placement.

EXAM:
3D DIAGNOSTIC LEFT MAMMOGRAM POST STEREOTACTIC BIOPSY

[L ML synth-2D]
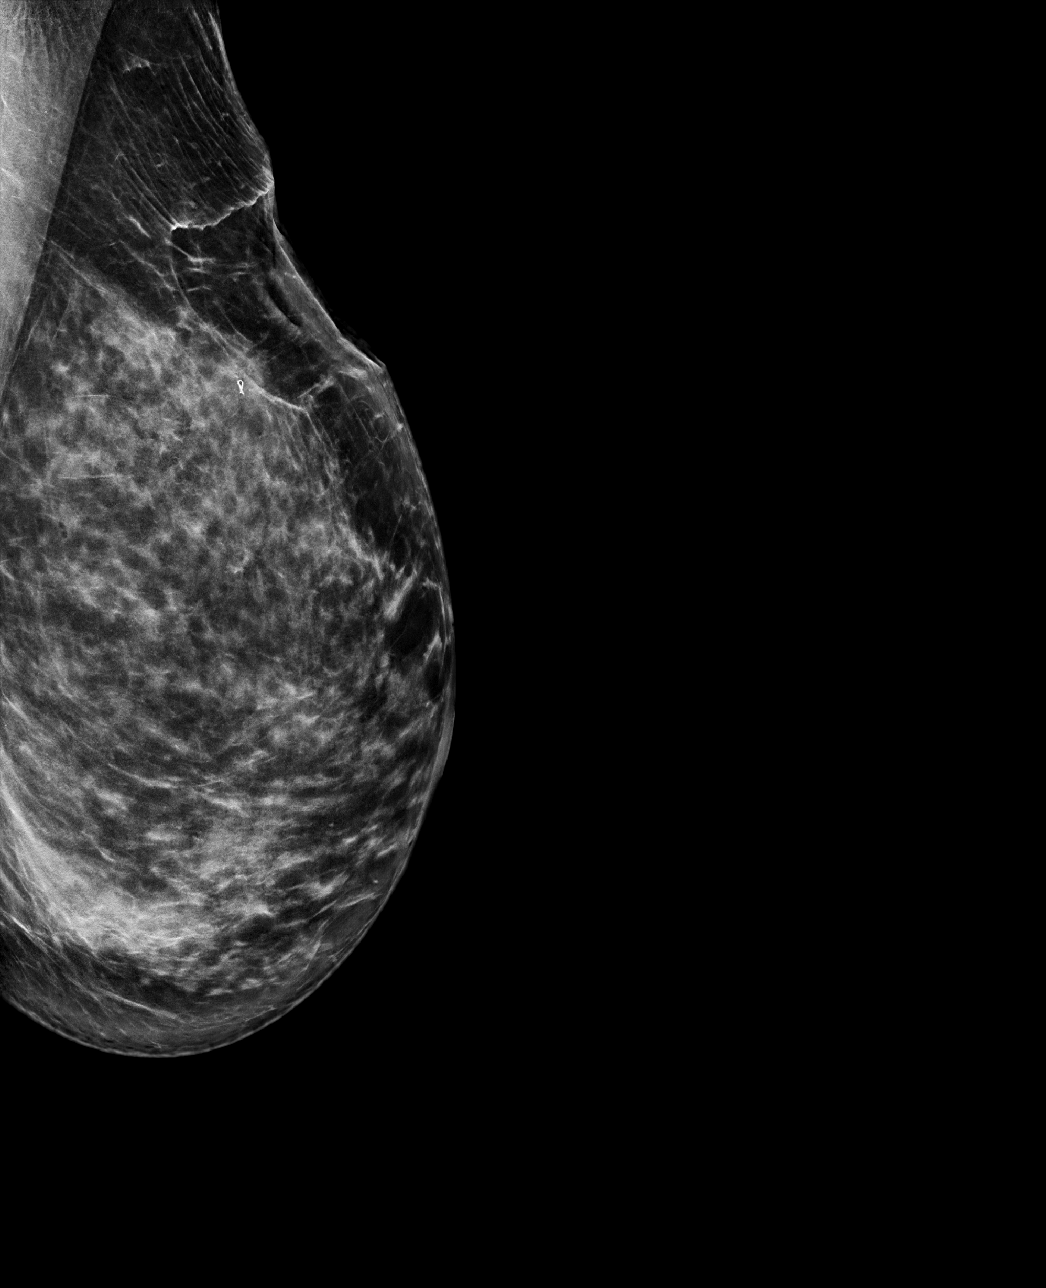

[L CC synth-2D]
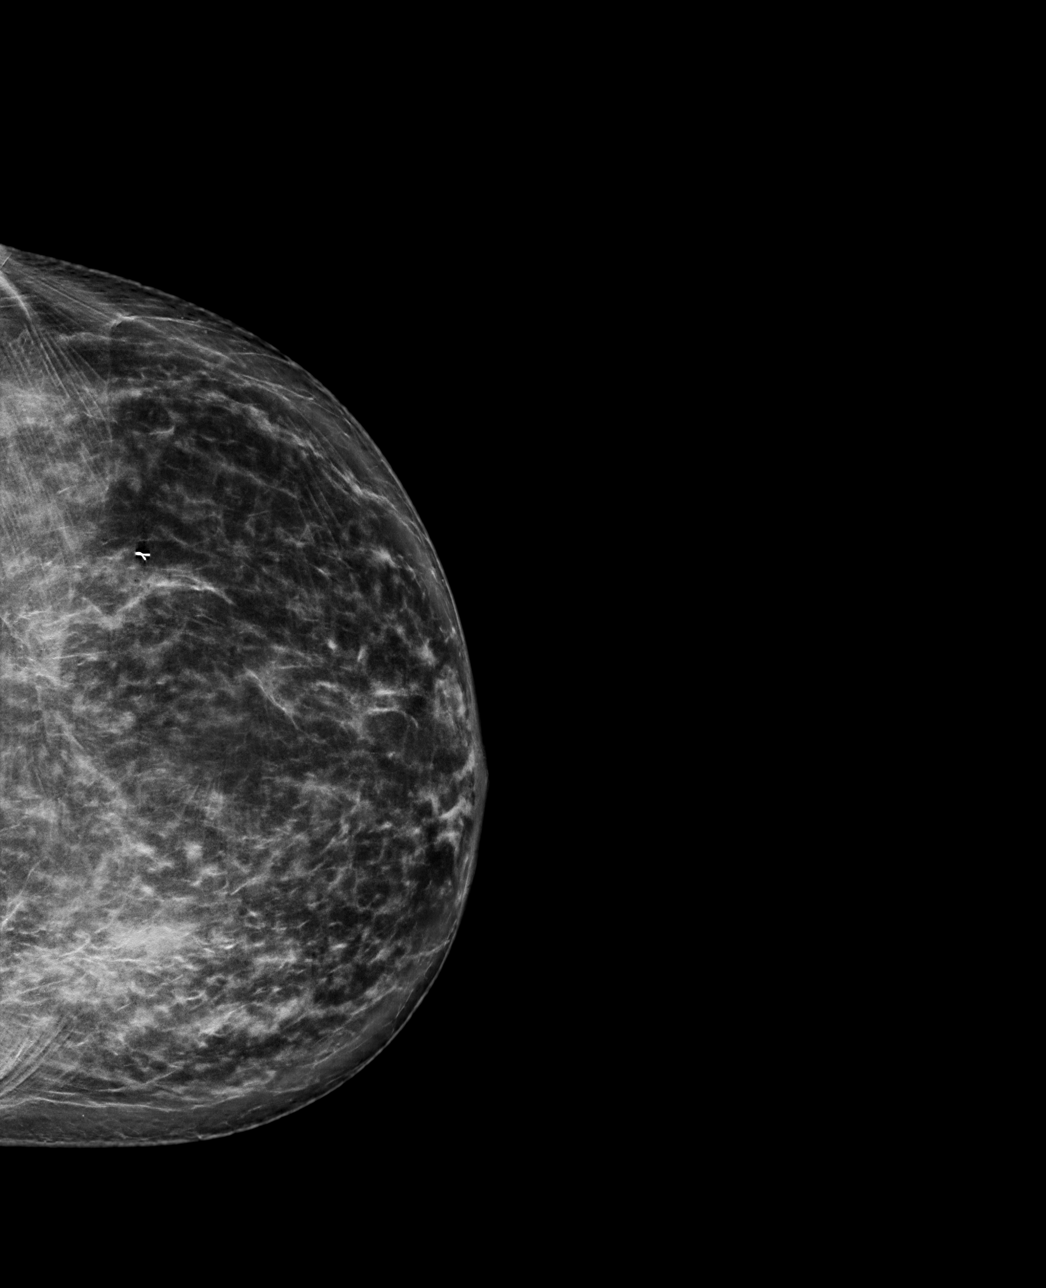

[L CC tomo · tomo slice 41/82.0]
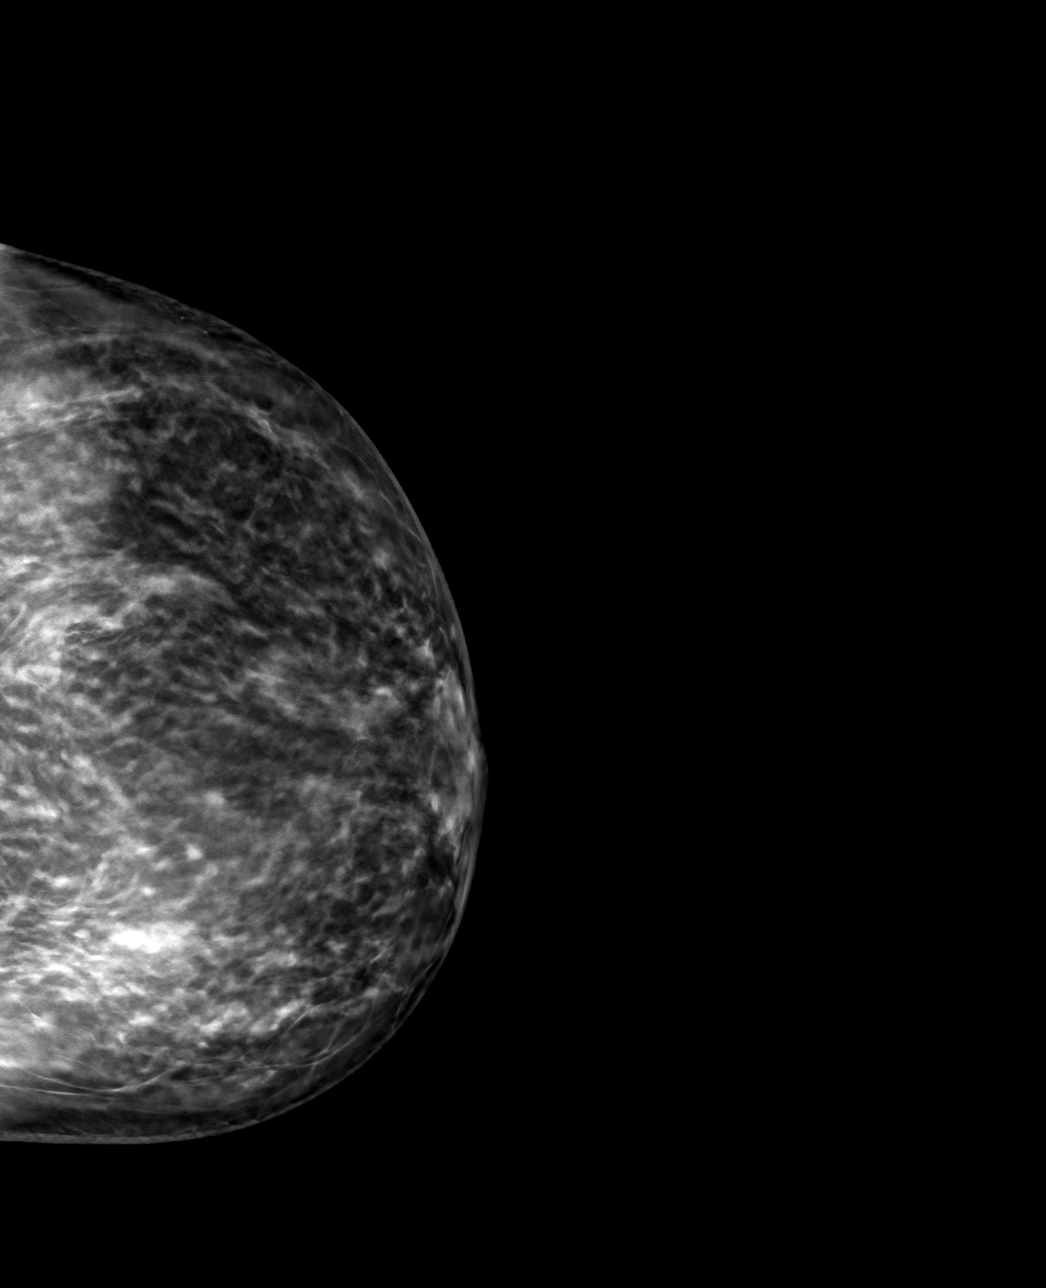

[L ML tomo · tomo slice 43/84.0]
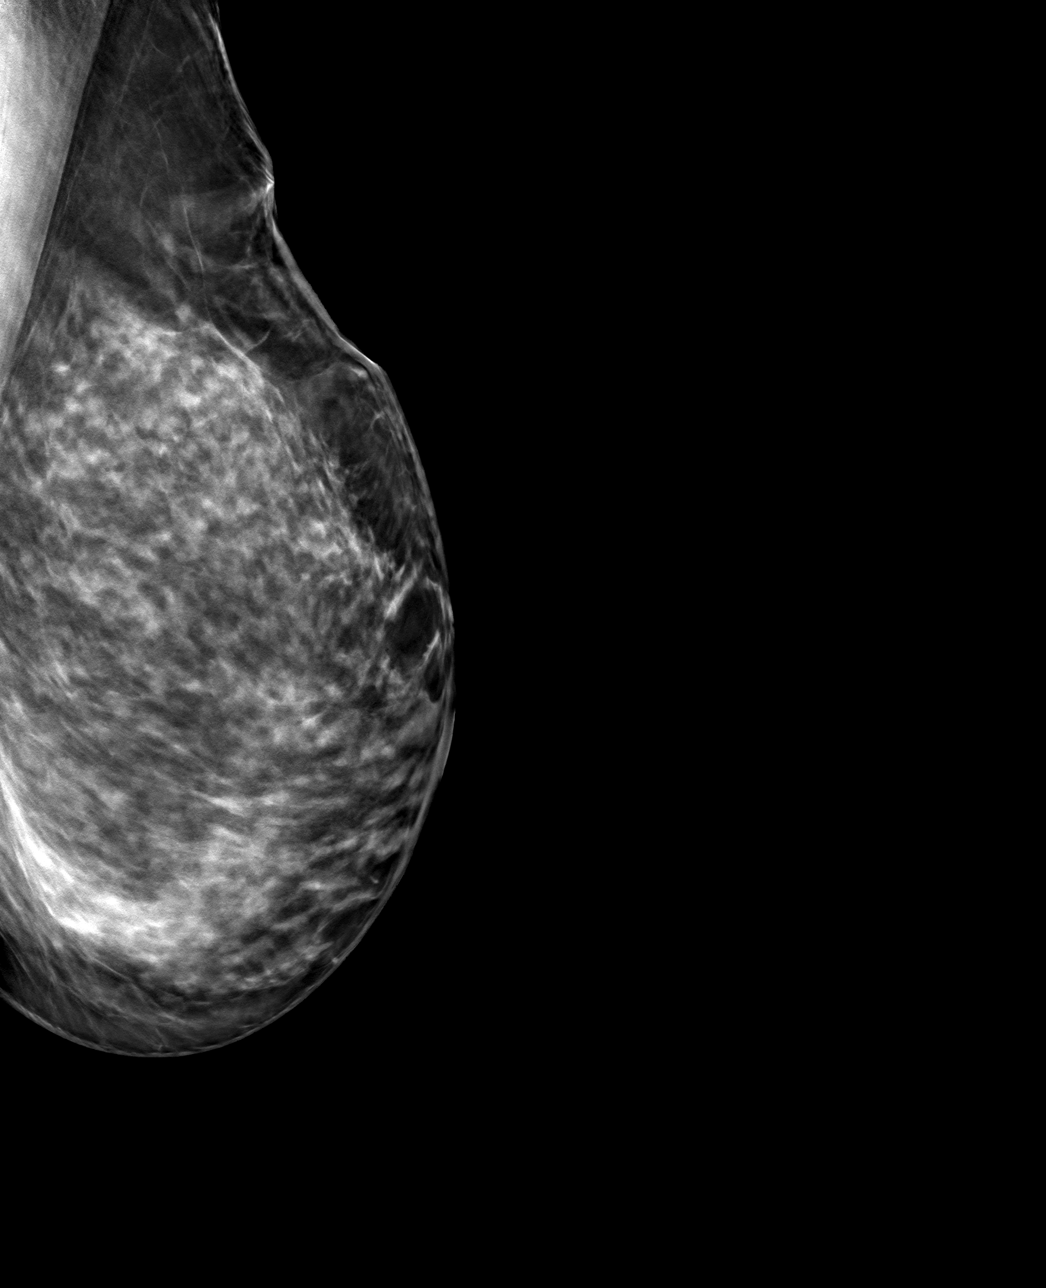

[4 of 12 positions shown; findings below may reference images not displayed]

FINDINGS: 3D Mammographic images were obtained following stereotactic guided
biopsy of calcifications in the upper-outer left breast. The biopsy
marking clip is in expected position at the site of biopsy.
IMPRESSION: Appropriate positioning of the ribbon shaped biopsy marking clip at
the site of biopsy in the upper-outer left.

Final Assessment: Post Procedure Mammograms for Marker Placement

## 2022-07-01 IMAGING — MG MM BREAST BX W LOC DEV 1ST LESION IMAGE BX SPEC STEREO GUIDE*L*
8 of 9 series · 8 of 17 positions shown · non-contrast
Comparison: Previous exams.
COMPARISON: Previous exams.

Addendum:
CLINICAL DATA: 60-year-old female presenting for stereotactic
biopsy of left breast calcifications.

EXAM:
BREAST STEREOTACTIC CORE NEEDLE BIOPSY

[L (1 of 6)]
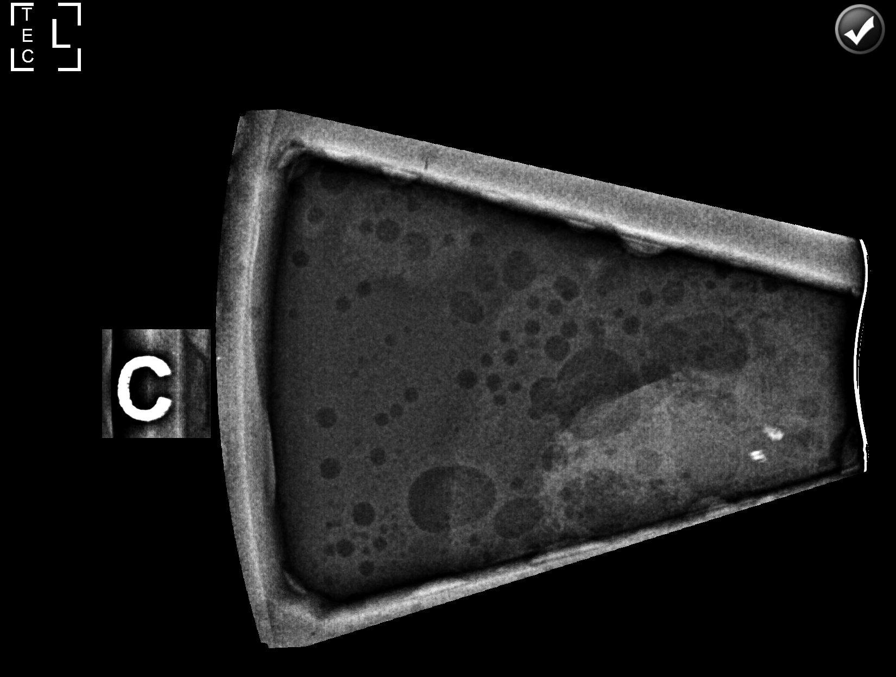

[L (2 of 6)]
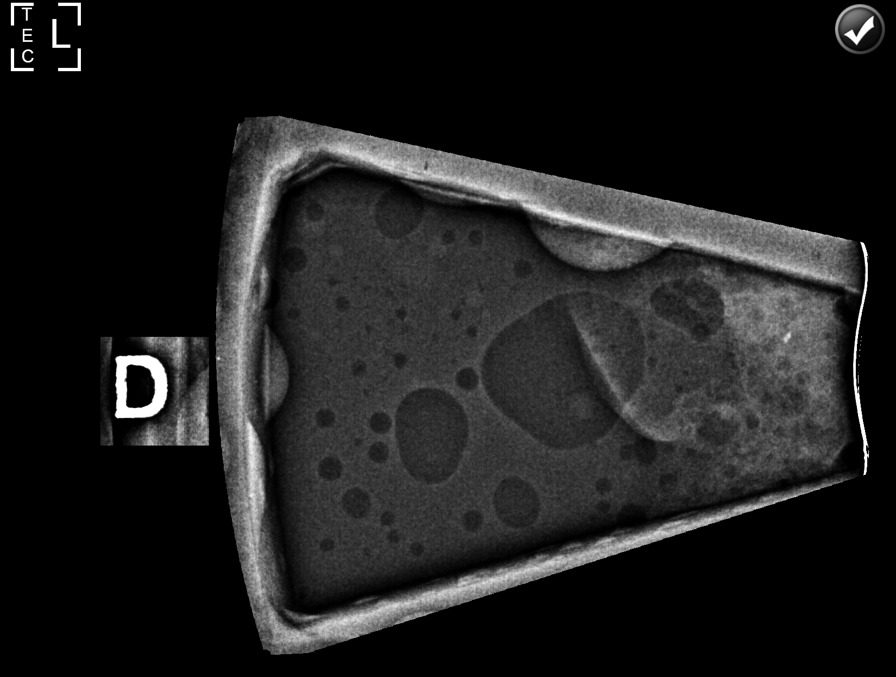

[L (3 of 6)]
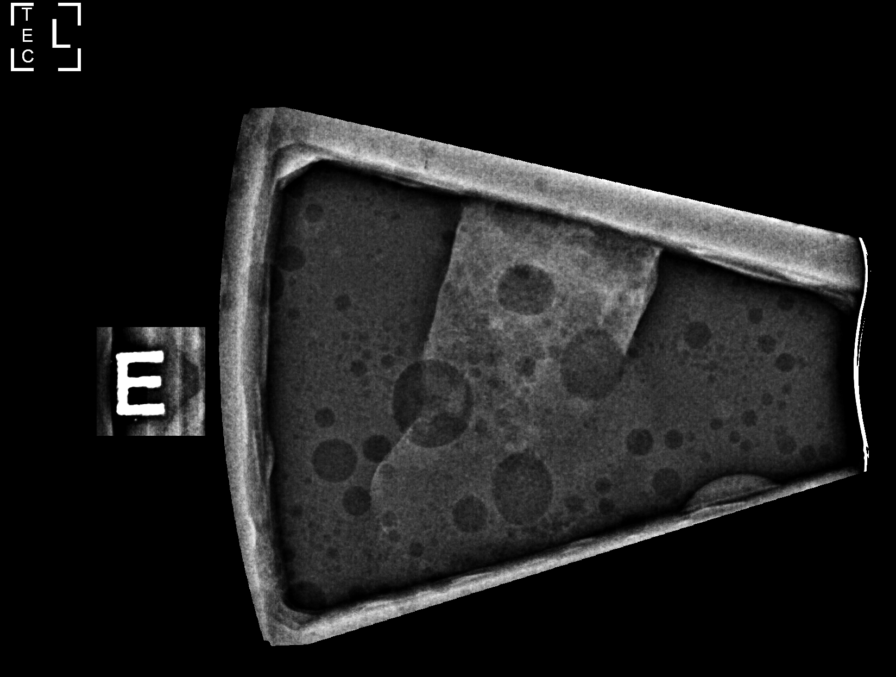

[L (4 of 6)]
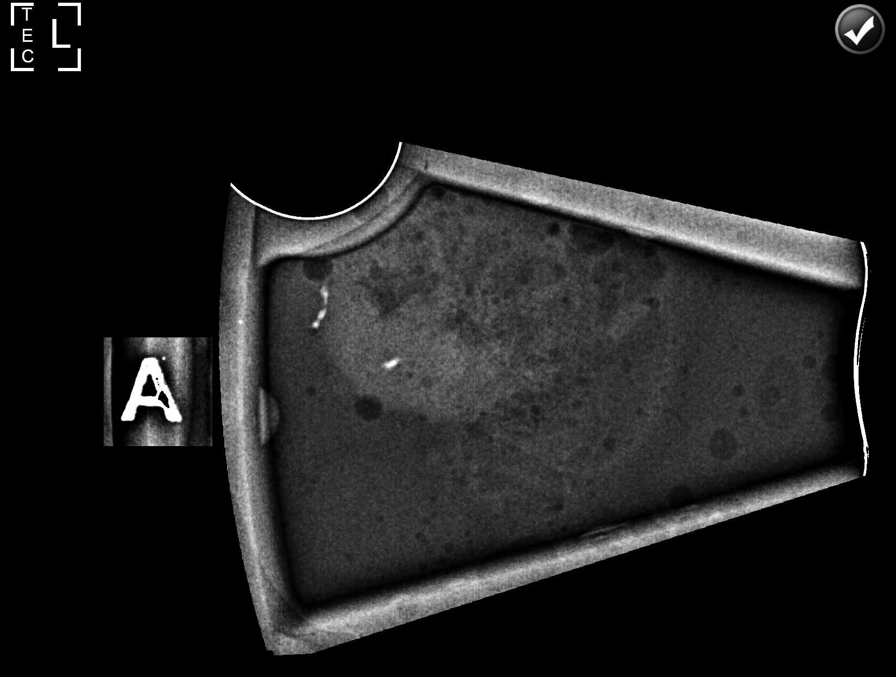

[L (5 of 6)]
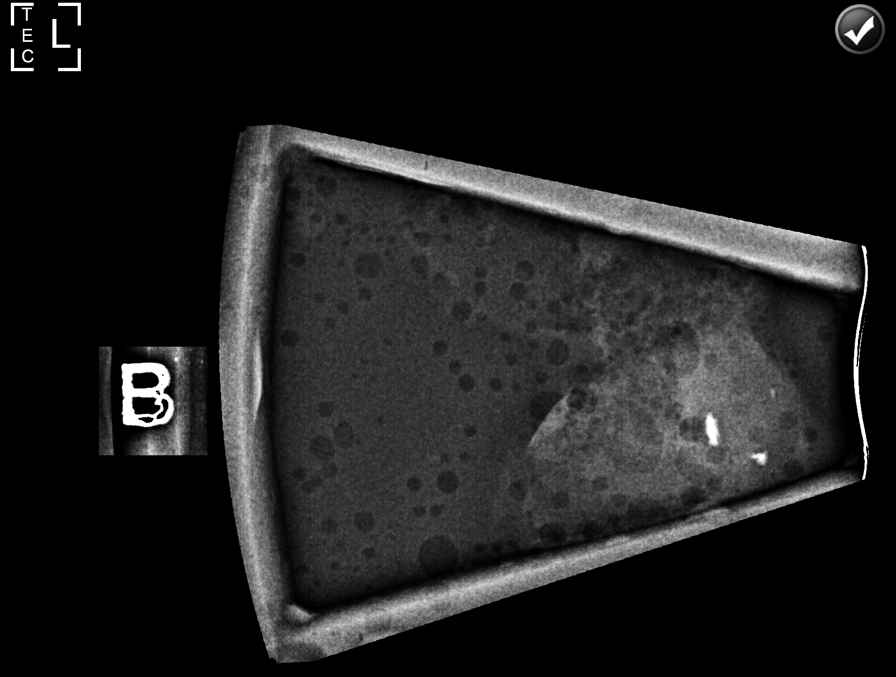

[L (6 of 6)]
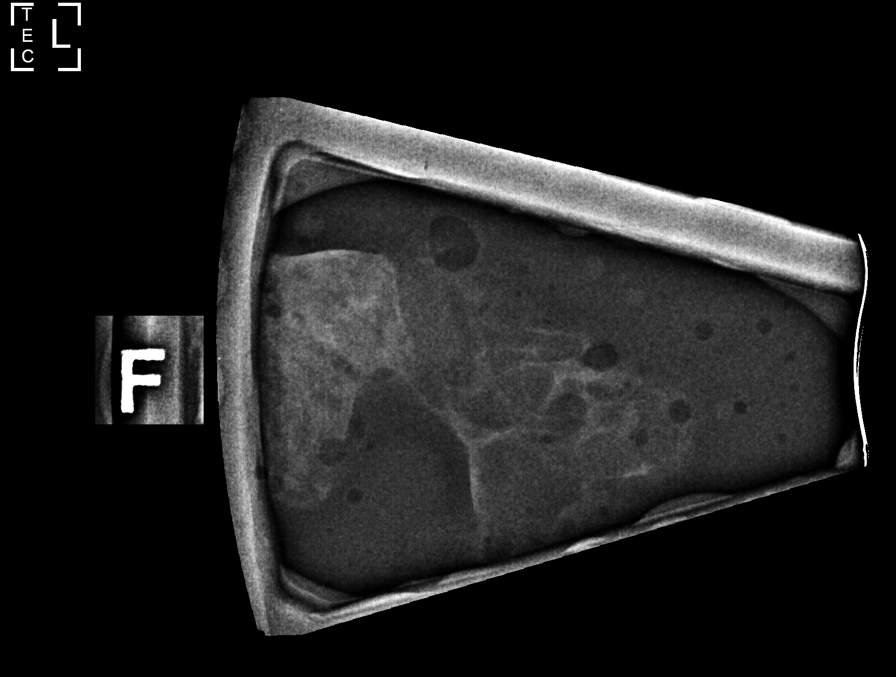

[L CC]
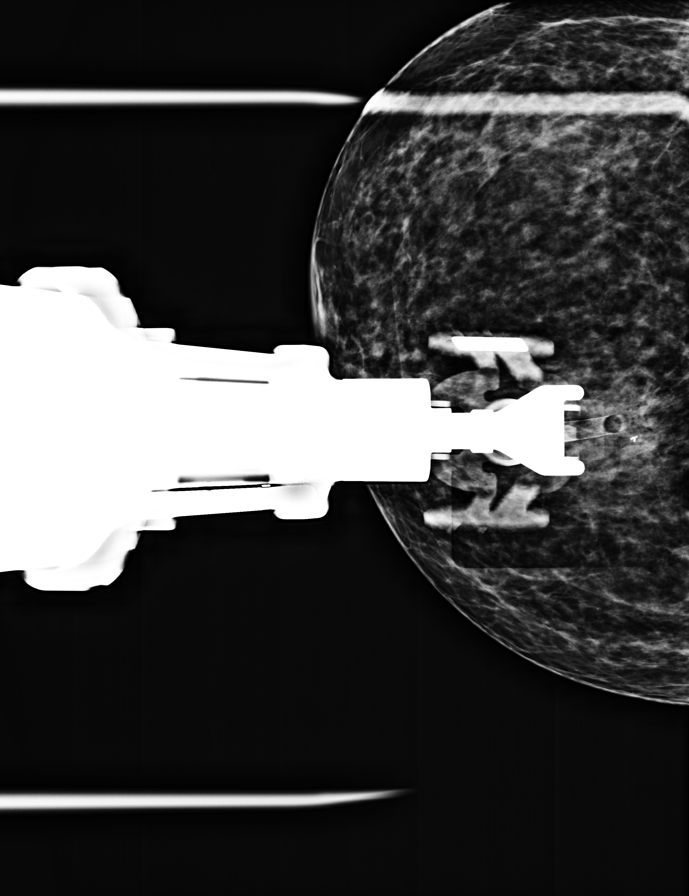

[L CC tomo · tomo slice 38/75.0]
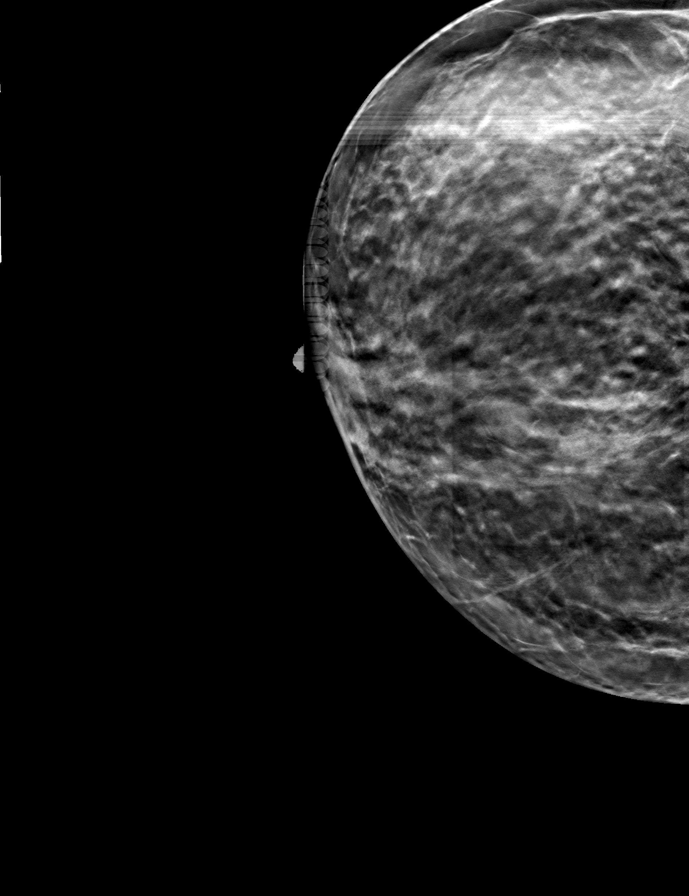

[8 of 17 positions shown; findings below may reference images not displayed]



Using sterile technique and 1% Lidocaine with epinephrine as local
anesthetic, under stereotactic guidance, a 9 gauge vacuum assisted
device was used to perform core needle biopsy of calcifications in
the upper-outer quadrant of the left breast using a superior
approach. Specimen radiograph was performed showing calcifications
in multiple core samples. Specimens with calcifications are
identified for pathology.

Lesion quadrant: Upper outer quadrant

At the conclusion of the procedure, ribbon shaped tissue marker clip
was deployed into the biopsy cavity. Follow-up 2-view mammogram was
performed and dictated separately.
IMPRESSION: Stereotactic-guided biopsy of upper-outer left breast. No apparent
complications.

ADDENDUM:
PATHOLOGY revealed: A. LEFT BREAST, UPPER OUTER CALCIFICATIONS;
STEREOTACTIC BIOPSY: - FIBROADENOMA WITH ASSOCIATED CALCIFICATIONS.
- NEGATIVE FOR ATYPIA AND MALIGNANCY.

Pathology results are CONCORDANT with imaging findings, per Dr.
Bambucafe Tarla.

Pathology results and recommendations were discussed with patient
via telephone on 10/23/2021. Patient reported biopsy site doing well
with no adverse symptoms, and only slight tenderness at the site.
Post biopsy care instructions were reviewed, questions were answered
and my direct phone number was provided. Patient was instructed to
call [[email or web address]] for any additional questions or concerns
related to biopsy site.

RECOMMENDATION: Patient instructed to continue monthly self breast
examinations and resume annual bilateral screening mammogram due
September 2022.

Pathology results reported by Shamori Galbert RN on 10/23/2021.



Using sterile technique and 1% Lidocaine with epinephrine as local
anesthetic, under stereotactic guidance, a 9 gauge vacuum assisted
device was used to perform core needle biopsy of calcifications in
the upper-outer quadrant of the left breast using a superior
approach. Specimen radiograph was performed showing calcifications
in multiple core samples. Specimens with calcifications are
identified for pathology.

Lesion quadrant: Upper outer quadrant

At the conclusion of the procedure, ribbon shaped tissue marker clip
was deployed into the biopsy cavity. Follow-up 2-view mammogram was
performed and dictated separately.
IMPRESSION: Stereotactic-guided biopsy of upper-outer left breast. No apparent
complications.

## 2022-08-27 ENCOUNTER — Other Ambulatory Visit: Payer: Self-pay | Admitting: Certified Nurse Midwife

## 2022-08-27 DIAGNOSIS — Z1231 Encounter for screening mammogram for malignant neoplasm of breast: Secondary | ICD-10-CM

## 2022-09-28 ENCOUNTER — Ambulatory Visit
Admission: RE | Admit: 2022-09-28 | Discharge: 2022-09-28 | Disposition: A | Discharge status: Home / Self Care | Payer: BC Managed Care – PPO | Source: Ambulatory Visit | Attending: Certified Nurse Midwife | Admitting: Certified Nurse Midwife

## 2022-09-28 DIAGNOSIS — Z1231 Encounter for screening mammogram for malignant neoplasm of breast: Secondary | ICD-10-CM | POA: Diagnosis not present

## 2022-10-18 ENCOUNTER — Other Ambulatory Visit: Payer: Self-pay | Admitting: Certified Nurse Midwife

## 2022-10-18 DIAGNOSIS — Z1231 Encounter for screening mammogram for malignant neoplasm of breast: Secondary | ICD-10-CM

## 2023-06-20 ENCOUNTER — Ambulatory Visit: Payer: BC Managed Care – PPO

## 2023-06-20 DIAGNOSIS — Z1211 Encounter for screening for malignant neoplasm of colon: Secondary | ICD-10-CM

## 2023-06-20 DIAGNOSIS — Z83719 Family history of colon polyps, unspecified: Secondary | ICD-10-CM

## 2023-06-20 DIAGNOSIS — K5730 Diverticulosis of large intestine without perforation or abscess without bleeding: Secondary | ICD-10-CM

## 2023-06-20 DIAGNOSIS — K641 Second degree hemorrhoids: Secondary | ICD-10-CM

## 2023-08-28 ENCOUNTER — Other Ambulatory Visit: Payer: Self-pay | Admitting: Certified Nurse Midwife

## 2023-08-28 DIAGNOSIS — Z1231 Encounter for screening mammogram for malignant neoplasm of breast: Secondary | ICD-10-CM

## 2023-10-01 ENCOUNTER — Ambulatory Visit
Admission: RE | Admit: 2023-10-01 | Discharge: 2023-10-01 | Disposition: A | Discharge status: Home / Self Care | Payer: 59 | Source: Ambulatory Visit | Attending: Certified Nurse Midwife | Admitting: Certified Nurse Midwife

## 2023-10-01 DIAGNOSIS — Z1231 Encounter for screening mammogram for malignant neoplasm of breast: Secondary | ICD-10-CM | POA: Insufficient documentation
